# Patient Record
Sex: Female | Born: 2005 | Race: White | Hispanic: No | Marital: Single | Smoking: Never smoker
Health system: Southern US, Community
[De-identification: ages and names within clinical notes are randomized; demographics above are authoritative.]

## PROBLEM LIST (undated history)

## (undated) HISTORY — PX: TYMPANOSTOMY TUBE PLACEMENT: SHX32

---
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
## 2024-04-13 ENCOUNTER — Ambulatory Visit
Admission: EM | Admit: 2024-04-13 | Discharge: 2024-04-13 | Disposition: A | Discharge status: Home / Self Care | Payer: Self-pay | Attending: Emergency Medicine | Admitting: Emergency Medicine

## 2024-04-13 ENCOUNTER — Encounter: Payer: Self-pay | Admitting: Emergency Medicine

## 2024-04-13 DIAGNOSIS — N390 Urinary tract infection, site not specified: Secondary | ICD-10-CM | POA: Insufficient documentation

## 2024-04-13 LAB — URINALYSIS, W/ REFLEX TO CULTURE (INFECTION SUSPECTED)
Bilirubin Urine: NEGATIVE
Glucose, UA: NEGATIVE mg/dL
Ketones, ur: NEGATIVE mg/dL
Nitrite: NEGATIVE
Protein, ur: 30 mg/dL — AB
Specific Gravity, Urine: 1.02 (ref 1.005–1.030)
pH: 7 (ref 5.0–8.0)

## 2024-04-13 MED ORDER — PHENAZOPYRIDINE HCL 200 MG PO TABS: 200.0000 mg | ORAL_TABLET | Freq: Three times a day (TID) | ORAL | 0 refills | Status: DC

## 2024-04-13 MED ORDER — NITROFURANTOIN MONOHYD MACRO 100 MG PO CAPS: 100.0000 mg | ORAL_CAPSULE | Freq: Two times a day (BID) | ORAL | 0 refills | Status: DC

## 2024-04-13 NOTE — ED Provider Notes (Signed)
 [facility]    CSN: [id number] Arrival date & time: 04/13/24  0847      History   Chief Complaint Chief Complaint  Patient presents with   Dysuria    HPI Cindy Carr is a 18 y.o. female.   HPI  18 year old female with past medical history significant for urinary urgency, benign joint hypermobility, amplified musculoskeletal pain syndrome, and abnormal menstruation presents for evaluation of burning with urination and urinary frequency that started 3 days ago.  She reports at the onset she noticed blood in her urine but she is not sure if it was coming from her urine or from the onset of her menstrual cycle which is present now.  She has not had any fever, nausea, or vomiting.  She does endorse low back pain.  [place]  History reviewed. No pertinent past medical history.  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      Home Medications    Prior to Admission medications   Medication Sig Start Date End Date Taking? Authorizing Provider  nitrofurantoin , macrocrystal-monohydrate, (MACROBID ) 100 MG capsule Take 1 capsule (100 mg total) by mouth 2 (two) times daily. 04/13/24  Yes Bernardino Ditch, NP  phenazopyridine  (PYRIDIUM ) 200 MG tablet Take 1 tablet (200 mg total) by mouth 3 (three) times daily. 04/13/24  Yes Bernardino Ditch, NP  Ascorbic Acid (VITAMIN C) 100 MG tablet Take 100 mg by mouth daily.    [provider]  cholecalciferol (VITAMIN D3) 25 MCG (1000 UNIT) tablet Take 1,000 Units by mouth daily.    [provider]  fluticasone  (FLONASE ) 50 MCG/ACT nasal spray Place 2  sprays into both nostrils daily. 04/17/20 05/03/20  Van Knee, MD    Family History Family History  Problem Relation Age of Onset   Thyroid disease Mother    Healthy Father     Social History Social History   Tobacco Use   Smoking status: Never   Smokeless tobacco: Never  Vaping Use   Vaping status: Never Used  Substance Use Topics   Alcohol use: No   Drug use: Never     Allergies   Patient has no known allergies.   Review of Systems Review of Systems  Constitutional:  Negative for fever.  Gastrointestinal:  Positive for abdominal pain. Negative for nausea and vomiting.  Genitourinary:  Positive for dysuria, frequency, hematuria and urgency.  Musculoskeletal:  Positive for back pain.  Skin:  Negative for rash.     Physical Exam Triage Vital Signs ED Triage Vitals  Encounter Vitals Group     BP      Girls Systolic BP Percentile      Girls Diastolic BP Percentile      Boys Systolic BP Percentile      Boys Diastolic BP Percentile      Pulse      Resp      Temp      Temp src      SpO2      Weight      Height      Head Circumference      Peak  Flow      Pain Score      Pain Loc      Pain Education      Exclude from Growth Chart    No data found.  Updated Vital Signs BP 102/66 (BP Location: Left Arm)   Pulse 80   Temp 98.8 F (37.1 C) (Oral)   Resp 16   Ht 5' 5 (1.651 m)   Wt 157 lb 9.6 oz (71.5 kg)   LMP 04/10/2024 (Exact Date)   SpO2 98%   BMI 26.23 kg/m   Visual Acuity Right Eye Distance:   Left Eye Distance:   Bilateral Distance:    Right Eye Near:   Left Eye Near:    Bilateral Near:     Physical Exam Vitals and nursing note reviewed.  Constitutional:      Appearance: Normal appearance. She is not ill-appearing.  HENT:     Head: Normocephalic and atraumatic.  Cardiovascular:     Rate and Rhythm: Normal rate and regular rhythm.     Pulses: Normal pulses.     Heart sounds: Normal heart sounds. No murmur heard.    No  friction rub. No gallop.  Pulmonary:     Effort: Pulmonary effort is normal.     Breath sounds: Normal breath sounds. No wheezing, rhonchi or rales.  Abdominal:     Tenderness: There is no right CVA tenderness or left CVA tenderness.  Skin:    General: Skin is warm and dry.     Capillary Refill: Capillary refill takes less than 2 seconds.     Findings: No rash.  Neurological:     General: No focal deficit present.     Mental Status: She is alert and oriented to person, place, and time.      UC Treatments / Results  Labs (all labs ordered are listed, but only abnormal results are displayed) Labs Reviewed  URINALYSIS, W/ REFLEX TO CULTURE (INFECTION SUSPECTED) - Abnormal; Notable for the following components:      Result Value   APPearance CLOUDY (*)    Hgb urine dipstick LARGE (*)    Protein, ur 30 (*)    Leukocytes,Ua SMALL (*)    Bacteria, UA FEW (*)    All other components within normal limits  URINE CULTURE    EKG   Radiology No results found.  Procedures Procedures (including critical care time)  Medications Ordered in UC Medications - No data to display  Initial Impression / Assessment and Plan / UC Course  I have reviewed the triage vital signs and the nursing notes.  Pertinent labs & imaging results that were available during my care of the patient were reviewed by me and considered in my medical decision making (see chart for details).   Patient is a pleasant, nontoxic-appearing 18 year old female presenting for evaluation of UTI symptoms that been going on for last 3 days.  The patient is here with her mother who reports that when she was younger she had a history of UTIs but she has not had one in a while.  She denies any recent hot tub use, decrease in her oral fluid intake, or holding her urine.  She did recently go to being in the [facility].  She also reports that at the onset of her symptoms she noticed blood in her urine but she is unsure if it was  coming from her urine or from her vagina as she started her menstrual cycle at the same time.  She has had burning, urgency, frequency.  She has no CVA tenderness on exam.  She did report that at the onset of symptoms she had upper abdominal pain but that has resolved.  Her urine specimen is cloudy.  I will order a urinalysis to assess for the presence of infection.  Urinalysis is still pending.  Urinalysis has a cloudy appearance with large hemoglobin, 30 protein, and small leukocyte esterase.  Negative for nitrites, ketones, or glucose.  Reflex microscopy shows 11-20 RBCs, few bacteria, amorphous crystals present.  I will order a urine culture.  I will discharge patient home on Macrobid  and Pyridium  to help with urinary discomfort and to cover for infection given the small amount of leukocyte esterase present.  Will also encourage her to increase oral fluid intake so that she increases her urine production and help flush her urinary tract.  Return precautions reviewed.  School note provided.   Final Clinical Impressions(s) / UC Diagnoses   Final diagnoses:  Lower urinary tract infectious disease     Discharge Instructions      Take the Macrobid  twice daily for 5 days with food for treatment of urinary tract infection.  Use the Pyridium  every 8 hours as needed for urinary discomfort.  This will turn your urine a bright red-orange.  Increase your oral fluid intake so that you increase your urine production and or flushing your urinary system.  Take an over-the-counter probiotic, such as Culturelle-Align-Activia, 1 hour after each dose of antibiotic to prevent diarrhea or yeast infections from forming.  We will culture urine and change the antibiotics if necessary.  Return for reevaluation, or see your primary care provider, for any new or worsening symptoms.      ED Prescriptions     Medication Sig Dispense Auth. Provider   nitrofurantoin , macrocrystal-monohydrate, (MACROBID ) 100  MG capsule Take 1 capsule (100 mg total) by mouth 2 (two) times daily. 10 capsule Bernardino Ditch, NP   phenazopyridine  (PYRIDIUM ) 200 MG tablet Take 1 tablet (200 mg total) by mouth 3 (three) times daily. 6 tablet Bernardino Ditch, NP      PDMP not reviewed this encounter.   Bernardino Ditch, NP 04/13/24 1026

## 2024-04-13 NOTE — Discharge Instructions (Signed)

## 2024-04-13 NOTE — ED Triage Notes (Signed)
 Pt c/o urinary freq,burning & pain x3 days. Currently on menstrual. Has tried cranberry pills w/o relief.

## 2024-04-14 LAB — URINE CULTURE: Special Requests: NORMAL

## 2024-04-19 ENCOUNTER — Ambulatory Visit (HOSPITAL_COMMUNITY): Payer: Self-pay

## 2024-06-07 ENCOUNTER — Ambulatory Visit
Admission: EM | Admit: 2024-06-07 | Discharge: 2024-06-07 | Disposition: A | Discharge status: Home / Self Care | Payer: Self-pay | Attending: Physician Assistant | Admitting: Physician Assistant

## 2024-06-07 DIAGNOSIS — N3001 Acute cystitis with hematuria: Secondary | ICD-10-CM | POA: Insufficient documentation

## 2024-06-07 LAB — URINALYSIS, W/ REFLEX TO CULTURE (INFECTION SUSPECTED)
Bilirubin Urine: NEGATIVE
Glucose, UA: NEGATIVE mg/dL
Ketones, ur: NEGATIVE mg/dL
Nitrite: NEGATIVE
Protein, ur: 30 mg/dL — AB
Specific Gravity, Urine: 1.025 (ref 1.005–1.030)
pH: 7 (ref 5.0–8.0)

## 2024-06-07 LAB — PREGNANCY, URINE: Preg Test, Ur: NEGATIVE

## 2024-06-07 MED ORDER — PHENAZOPYRIDINE HCL 200 MG PO TABS: 200.0000 mg | ORAL_TABLET | Freq: Three times a day (TID) | ORAL | 0 refills | Status: DC

## 2024-06-07 MED ORDER — CEFDINIR 300 MG PO CAPS: 300.0000 mg | ORAL_CAPSULE | Freq: Two times a day (BID) | ORAL | 0 refills | Status: AC

## 2024-06-07 NOTE — ED Triage Notes (Signed)
 Pt c/o urinary freq & burning x1 day. Had 1 episode of hematuria today. No OTC meds.

## 2024-06-07 NOTE — Discharge Instructions (Signed)

## 2024-06-07 NOTE — ED Provider Notes (Signed)
 [facility]    CSN: [id number] Arrival date & time: 06/07/24  1224      History   Chief Complaint Chief Complaint  Patient presents with   Dysuria   Hematuria    HPI Cindy Carr is a 18 y.o. female presenting for dysuria, frequency and urgency since yesterday. Also reports small amount of hematuria and bladder pain. No flank pain or abnormal vaginal discharge.  LMP: 9/25-9/29  Patient was seen here 2 months ago for UTI and treated with Macrobid . Urine culture came back with multiple species suggesting need for recollection. Urine was never recollected and symptoms resolved with antibiotics.    HPI  History reviewed. No pertinent past medical history.  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      Home Medications    Prior to Admission medications   Medication Sig Start Date End Date Taking? Authorizing Provider  cefdinir (OMNICEF) 300 MG capsule Take 1 capsule (300 mg total) by mouth 2 (two) times daily for 7 days. 06/07/24 06/14/24 Yes Arvis Jolan NOVAK, PA-C  phenazopyridine  (PYRIDIUM ) 200 MG tablet Take 1 tablet (200 mg total) by mouth 3 (three) times daily. 06/07/24  Yes Arvis Jolan NOVAK, PA-C  Ascorbic Acid (VITAMIN C) 100 MG tablet Take 100 mg by mouth daily.    [provider]  cholecalciferol (VITAMIN D3) 25 MCG (1000 UNIT) tablet Take 1,000 Units by mouth daily.    [provider]  fluticasone  (FLONASE ) 50 MCG/ACT nasal spray Place 2 sprays into both nostrils daily. 04/17/20 05/03/20  Van Knee, MD    Family History Family History  Problem Relation Age of Onset   Thyroid disease  Mother    Healthy Father     Social History Social History   Tobacco Use   Smoking status: Never   Smokeless tobacco: Never  Vaping Use   Vaping status: Never Used  Substance Use Topics   Alcohol use: No   Drug use: Never     Allergies   Patient has no known allergies.   Review of Systems Review of Systems  Constitutional:  Negative for chills, fatigue and fever.  Gastrointestinal:  Positive for abdominal pain. Negative for diarrhea, nausea and vomiting.  Genitourinary:  Positive for dysuria, frequency, hematuria and urgency. Negative for decreased urine volume, flank pain, pelvic pain, vaginal bleeding, vaginal discharge and vaginal pain.  Musculoskeletal:  Negative for back pain.  Skin:  Negative for rash.     Physical Exam Triage Vital Signs ED Triage Vitals  Encounter Vitals Group     BP --      Girls Systolic BP Percentile --      Girls Diastolic BP Percentile --      Boys Systolic BP Percentile --      Boys Diastolic BP Percentile --      Pulse --      Resp 06/07/24 1231 16     Temp --      Temp Source 06/07/24 1231 Oral     SpO2 --      Weight 06/07/24 1230 150 lb (68 kg)     Height 06/07/24 1230 5' 5 (1.651 m)  Head Circumference --      Peak Flow --      Pain Score 06/07/24 1234 5     Pain Loc --      Pain Education --      Exclude from Growth Chart --    No data found.  Updated Vital Signs BP 103/71 (BP Location: Right Arm)   Pulse 97   Temp 98.1 F (36.7 C) (Oral)   Resp 16   Ht 5' 5 (1.651 m)   Wt 150 lb (68 kg)   LMP 05/14/2024 (Exact Date)   SpO2 96%   BMI 24.96 kg/m      Physical Exam Vitals and nursing note reviewed.  Constitutional:      General: She is not in acute distress.    Appearance: Normal appearance. She is not ill-appearing or toxic-appearing.  HENT:     Head: Normocephalic and atraumatic.  Eyes:     General: No scleral icterus.       Right eye: No discharge.        Left eye: No discharge.      Conjunctiva/sclera: Conjunctivae normal.  Cardiovascular:     Rate and Rhythm: Normal rate and regular rhythm.     Heart sounds: Normal heart sounds.  Pulmonary:     Effort: Pulmonary effort is normal. No respiratory distress.     Breath sounds: Normal breath sounds.  Abdominal:     Palpations: Abdomen is soft.     Tenderness: There is abdominal tenderness (suprapubic). There is no right CVA tenderness or left CVA tenderness.  Musculoskeletal:     Cervical back: Neck supple.  Skin:    General: Skin is dry.  Neurological:     General: No focal deficit present.     Mental Status: She is alert. Mental status is at baseline.     Motor: No weakness.     Gait: Gait normal.  Psychiatric:        Mood and Affect: Mood normal.        Behavior: Behavior normal.      UC Treatments / Results  Labs (all labs ordered are listed, but only abnormal results are displayed) Labs Reviewed  URINALYSIS, W/ REFLEX TO CULTURE (INFECTION SUSPECTED) - Abnormal; Notable for the following components:      Result Value   APPearance HAZY (*)    Hgb urine dipstick SMALL (*)    Protein, ur 30 (*)    Leukocytes,Ua TRACE (*)    Bacteria, UA MANY (*)    All other components within normal limits  URINE CULTURE  PREGNANCY, URINE    EKG   Radiology No results found.  Procedures Procedures (including critical care time)  Medications Ordered in UC Medications - No data to display  Initial Impression / Assessment and Plan / UC Course  I have reviewed the triage vital signs and the nursing notes.  Pertinent labs & imaging results that were available during my care of the patient were reviewed by me and considered in my medical decision making (see chart for details).   18 y/o female presents for dysuria, frequency, and urgency since yesterday.  UA obtained.  Hazy urine with small hemoglobin, trace leukocytes, clumps of white blood cells and many bacteria.  Will send urine for culture. Urine  pregnancy obtained. Negative.   Acute cystitis.  Treating at this time with cefdinir and Pyridium .  Encouraged increasing rest and fluids.  Reviewed may amend treatment based on culture result.  Reviewed return  precautions.   Final Clinical Impressions(s) / UC Diagnoses   Final diagnoses:  Acute cystitis with hematuria     Discharge Instructions      UTI: Based on either symptoms or urinalysis, you may have a urinary tract infection. We will send the urine for culture and call with results in a few days. Begin antibiotics at this time. Your symptoms should be much improved over the next 2-3 days. Increase rest and fluid intake. If for some reason symptoms are worsening or not improving after a couple of days or the urine culture determines the antibiotics you are taking will not treat the infection, the antibiotics may be changed. Return or go to ER for fever, back pain, worsening urinary pain, discharge, increased blood in urine. May take Tylenol or Motrin OTC for pain relief or consider AZO if no contraindications      ED Prescriptions     Medication Sig Dispense Auth. Provider   phenazopyridine  (PYRIDIUM ) 200 MG tablet Take 1 tablet (200 mg total) by mouth 3 (three) times daily. 6 tablet Arvis Huxley B, PA-C   cefdinir (OMNICEF) 300 MG capsule Take 1 capsule (300 mg total) by mouth 2 (two) times daily for 7 days. 14 capsule Arvis Huxley NOVAK, PA-C      PDMP not reviewed this encounter.   Arvis Huxley NOVAK, PA-C 06/07/24 1316

## 2024-06-09 ENCOUNTER — Ambulatory Visit (HOSPITAL_COMMUNITY): Payer: Self-pay

## 2024-06-09 LAB — URINE CULTURE: Culture: >=100000 — AB

## 2024-06-09 MED ORDER — SULFAMETHOXAZOLE-TRIMETHOPRIM 800-160 MG PO TABS
1.0000 | ORAL_TABLET | Freq: Two times a day (BID) | ORAL | 0 refills | Status: AC
Start: 2024-06-09 — End: 2024-06-16

## 2024-08-07 ENCOUNTER — Ambulatory Visit
Admission: EM | Admit: 2024-08-07 | Discharge: 2024-08-07 | Disposition: A | Discharge status: Home / Self Care | Payer: Self-pay | Attending: Physician Assistant | Admitting: Physician Assistant

## 2024-08-07 ENCOUNTER — Encounter: Payer: Self-pay | Admitting: Emergency Medicine

## 2024-08-07 DIAGNOSIS — R350 Frequency of micturition: Secondary | ICD-10-CM | POA: Insufficient documentation

## 2024-08-07 DIAGNOSIS — R300 Dysuria: Secondary | ICD-10-CM | POA: Insufficient documentation

## 2024-08-07 DIAGNOSIS — Z87440 Personal history of urinary (tract) infections: Secondary | ICD-10-CM | POA: Insufficient documentation

## 2024-08-07 LAB — POCT URINE DIPSTICK
Bilirubin, UA: NEGATIVE
Blood, UA: NEGATIVE
Glucose, UA: NEGATIVE mg/dL
Ketones, POC UA: NEGATIVE mg/dL
Leukocytes, UA: NEGATIVE
Nitrite, UA: NEGATIVE
Protein Ur, POC: NEGATIVE mg/dL
Spec Grav, UA: >=1.03 — AB
Urobilinogen, UA: 0.2 U/dL
pH, UA: 6

## 2024-08-07 NOTE — Discharge Instructions (Signed)
 Your urine did not show any evidence of infection but it was concentrated.  I recommend that you drink plenty of fluid and avoid bladder irritants including caffeine.  I am sending your urine off for culture and I will contact you if this is abnormal and changes our treatment plan (we need to start antibiotics).  If you have any worsening symptoms please return for reevaluation.

## 2024-08-07 NOTE — ED Triage Notes (Signed)
 Pt c/o urinary frequency, dysuria. Started about 2 days ago. She states her symptoms are intermittent. Denies fever or vaginal discharge.

## 2024-08-07 NOTE — ED Provider Notes (Signed)
 " [facility]    CSN: [id number] Arrival date & time: 08/07/24  0830      History   Chief Complaint Chief Complaint  Patient presents with   Urinary Frequency    HPI Cindy Carr is a 18 y.o. female.   Patient presents today with a 2-day history of UTI symptoms including frequency, urgency, dysuria.  She does have a history of UTI with similar presentation.  She has not tried any over-the-counter medication for symptom management.  She denies any fever, chest pain, abdominal pain, nausea, vomiting, vaginal symptoms.  She has never been sexually active.  She has never seen a urologist and denies history of nephrolithiasis.   She denies any recent urogenital procedure, self-catheterization, single kidney.  She was last seen and treated by our clinic on June 07, 2024 at which point she grew staph and E. coli and was treated with Bactrim  DS.  She has not had additional antibiotics since that time.    History reviewed. No pertinent past medical history.  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      Home Medications    Prior to Admission medications  Medication Sig Start Date End Date Taking? Authorizing Provider  cetirizine (ZYRTEC) 10 MG tablet Take 10 mg by mouth daily. 04/08/24  Yes [provider]  fluticasone  (FLONASE ) 50 MCG/ACT nasal spray Place 2 sprays into both nostrils daily. 04/17/20 05/03/20  Van Knee, MD    Family History Family History  Problem Relation Age of Onset   Thyroid disease Mother    Healthy Father     Social History Social History[1]   Allergies   Patient has no known  allergies.   Review of Systems Review of Systems  Constitutional:  Negative for activity change, appetite change, fatigue and fever.  Gastrointestinal:  Negative for abdominal pain, diarrhea, nausea and vomiting.  Genitourinary:  Positive for dysuria, frequency and urgency. Negative for flank pain, hematuria, vaginal bleeding, vaginal discharge and vaginal pain.  Musculoskeletal:  Positive for back pain (Chronic and at baseline). Negative for arthralgias and myalgias.     Physical Exam Triage Vital Signs ED Triage Vitals  Encounter Vitals Group     BP 08/07/24 0905 107/72     Girls Systolic BP Percentile --      Girls Diastolic BP Percentile --      Boys Systolic BP Percentile --      Boys Diastolic BP Percentile --      Pulse Rate 08/07/24 0905 89     Resp 08/07/24 0905 16     Temp 08/07/24 0905 99 F (37.2 C)     Temp Source 08/07/24 0905 Oral     SpO2 08/07/24 0905 96 %     Weight 08/07/24 0904 149 lb 14.6 oz (68 kg)     Height 08/07/24 0904 5' 5 (1.651 m)     Head Circumference --      Peak Flow --      Pain Score 08/07/24 0903 3     Pain Loc --      Pain Education --      Exclude from Growth Chart --  No data found.  Updated Vital Signs BP 107/72 (BP Location: Right Arm)   Pulse 89   Temp 99 F (37.2 C) (Oral)   Resp 16   Ht 5' 5 (1.651 m)   Wt 149 lb 14.6 oz (68 kg)   LMP 07/12/2024 (Approximate)   SpO2 96%   BMI 24.95 kg/m   Visual Acuity Right Eye Distance:   Left Eye Distance:   Bilateral Distance:    Right Eye Near:   Left Eye Near:    Bilateral Near:     Physical Exam Vitals reviewed.  Constitutional:      General: She is awake. She is not in acute distress.    Appearance: Normal appearance. She is well-developed. She is not ill-appearing.     Comments: Very pleasant female presented age in no acute distress sitting comfortably in exam room  HENT:     Head: Normocephalic and atraumatic.  Cardiovascular:     Rate and Rhythm: Normal  rate and regular rhythm.     Heart sounds: Normal heart sounds, S1 normal and S2 normal. No murmur heard. Pulmonary:     Effort: Pulmonary effort is normal.     Breath sounds: Normal breath sounds. No wheezing, rhonchi or rales.     Comments: Clear to auscultation bilaterally Abdominal:     General: Bowel sounds are normal.     Palpations: Abdomen is soft.     Tenderness: There is abdominal tenderness in the suprapubic area. There is no right CVA tenderness, left CVA tenderness, guarding or rebound.     Comments: Benign abdominal exam.  Mild suprapubic tenderness but no evidence of acute abdomen.  No CVA tenderness.  Genitourinary:    Comments: Exam deferred Psychiatric:        Behavior: Behavior is cooperative.      UC Treatments / Results  Labs (all labs ordered are listed, but only abnormal results are displayed) Labs Reviewed  POCT URINE DIPSTICK - Abnormal; Notable for the following components:      Result Value   Clarity, UA cloudy (*)    Spec Grav, UA >=1.030 (*)    All other components within normal limits  URINE CULTURE    EKG   Radiology No results found.  Procedures Procedures (including critical care time)  Medications Ordered in UC Medications - No data to display  Initial Impression / Assessment and Plan / UC Course  I have reviewed the triage vital signs and the nursing notes.  Pertinent labs & imaging results that were available during my care of the patient were reviewed by me and considered in my medical decision making (see chart for details).     Patient is well-appearing, afebrile, nontoxic, nontachycardic.  No indication for urine pregnancy as patient has never been sexually active.  UA was concentrated but showed no evidence of infection so we will send this for culture given she is symptomatic but defer antibiotics until culture results are available.  We discussed that given her recurrent symptoms it is possible that she is also experiencing  interstitial cystitis and recommended that she avoid bladder irritants and push fluids.  If her symptoms are not improving she is to follow-up with urology and was given the contact information for local provider.  We discussed that if she develops any pelvic pain, abdominal pain, fever, nausea, vomiting, worsening UTI symptoms she should return for reevaluation.  Strict return precautions given.  Excuse note provided.  Final Clinical Impressions(s) / UC Diagnoses  Final diagnoses:  Urinary frequency     Discharge Instructions      Your urine did not show any evidence of infection but it was concentrated.  I recommend that you drink plenty of fluid and avoid bladder irritants including caffeine.  I am sending your urine off for culture and I will contact you if this is abnormal and changes our treatment plan (we need to start antibiotics).  If you have any worsening symptoms please return for reevaluation.     ED Prescriptions   None    PDMP not reviewed this encounter.     [1]  Social History Tobacco Use   Smoking status: Never   Smokeless tobacco: Never  Vaping Use   Vaping status: Never Used  Substance Use Topics   Alcohol use: No   Drug use: Never     Sherrell Rocky POUR, PA-C 08/07/24 0942  "

## 2024-08-09 ENCOUNTER — Telehealth: Payer: Self-pay | Admitting: Nurse Practitioner

## 2024-08-09 DIAGNOSIS — R3989 Other symptoms and signs involving the genitourinary system: Secondary | ICD-10-CM

## 2024-08-09 MED ORDER — NITROFURANTOIN MONOHYD MACRO 100 MG PO CAPS: 100.0000 mg | ORAL_CAPSULE | Freq: Two times a day (BID) | ORAL | 0 refills | Status: AC

## 2024-08-09 NOTE — Progress Notes (Signed)

## 2024-08-10 ENCOUNTER — Ambulatory Visit (HOSPITAL_COMMUNITY): Payer: Self-pay

## 2024-08-10 LAB — URINE CULTURE: Culture: 10000 — AB
